# Patient Record
Sex: Female | Born: 1955 | Race: White | Hispanic: No | Marital: Married | Smoking: Former smoker
Health system: Southern US, Community
[De-identification: ages and names within clinical notes are randomized; demographics above are authoritative.]

## PROBLEM LIST (undated history)

## (undated) HISTORY — PX: DILATION AND CURETTAGE OF UTERUS: SHX78

---

## 2009-11-17 HISTORY — PX: COLONOSCOPY: SHX174

## 2014-07-23 ENCOUNTER — Emergency Department: Payer: Self-pay | Admitting: Internal Medicine

## 2014-07-23 LAB — CBC
HCT: 41.5 % (ref 35.0–47.0)
HGB: 14 g/dL (ref 12.0–16.0)
MCH: 31.7 pg (ref 26.0–34.0)
MCHC: 33.8 g/dL (ref 32.0–36.0)
MCV: 94 fL (ref 80–100)
Platelet: 332 10*3/uL (ref 150–440)
RBC: 4.43 10*6/uL (ref 3.80–5.20)
RDW: 12.7 % (ref 11.5–14.5)
WBC: 7.5 10*3/uL (ref 3.6–11.0)

## 2014-07-23 LAB — HEPATIC FUNCTION PANEL A (ARMC)
ALBUMIN: 4.1 g/dL (ref 3.4–5.0)
Alkaline Phosphatase: 111 U/L
Bilirubin, Direct: <0.1 mg/dL (ref 0.00–0.20)
Bilirubin,Total: 0.4 mg/dL (ref 0.2–1.0)
SGOT(AST): 33 U/L (ref 15–37)
SGPT (ALT): 30 U/L
Total Protein: 7.9 g/dL (ref 6.4–8.2)

## 2014-07-23 LAB — BASIC METABOLIC PANEL
Anion Gap: 10 (ref 7–16)
BUN: 10 mg/dL (ref 7–18)
CALCIUM: 8.8 mg/dL (ref 8.5–10.1)
CHLORIDE: 106 mmol/L (ref 98–107)
CO2: 22 mmol/L (ref 21–32)
Creatinine: 0.93 mg/dL (ref 0.60–1.30)
EGFR (African American): >60
EGFR (Non-African Amer.): >60
Glucose: 100 mg/dL — ABNORMAL HIGH (ref 65–99)
OSMOLALITY: 275 (ref 275–301)
POTASSIUM: 3.4 mmol/L — AB (ref 3.5–5.1)
SODIUM: 138 mmol/L (ref 136–145)

## 2014-07-23 LAB — D-DIMER(ARMC): D-Dimer: 454 ng/ml

## 2014-07-23 LAB — TROPONIN I: Troponin-I: <0.02 ng/mL

## 2015-04-26 ENCOUNTER — Ambulatory Visit
Admission: EM | Admit: 2015-04-26 | Discharge: 2015-04-26 | Disposition: A | Discharge status: Home / Self Care | Payer: Managed Care, Other (non HMO) | Attending: Family Medicine | Admitting: Family Medicine

## 2015-04-26 DIAGNOSIS — L03119 Cellulitis of unspecified part of limb: Secondary | ICD-10-CM | POA: Diagnosis not present

## 2015-04-26 MED ORDER — CEPHALEXIN 500 MG PO CAPS: 500.0000 mg | ORAL_CAPSULE | Freq: Three times a day (TID) | ORAL | Status: DC

## 2015-04-26 NOTE — ED Notes (Signed)
Pain and swelling in elbow x2 weeks. No known injury. Seen at [facility] for same a week ago.

## 2015-04-26 NOTE — ED Provider Notes (Signed)
CSN: [id number]     Arrival date & time 04/26/15  0946 History   First MD Initiated Contact with Patient 04/26/15 1026     Chief Complaint  Patient presents with  . Joint Swelling   (Consider location/radiation/quality/duration/timing/severity/associated sxs/prior Treatment) HPI Comments: 59 yo female with a 2 weeks h/o left elbow pain, redness and swelling. States was seen at [facility] a week ago and given keflex twice daily. Symptoms improved significantly but have not resolved. (patient showed pictures of condition one week ago and appearance a lot more extensive)  The history is provided by the patient.    History reviewed. No pertinent past medical history. Past Surgical History  Procedure Laterality Date  . Cesarean section     Family History  Problem Relation Age of Onset  . Diabetes Father    History  Substance Use Topics  . Smoking status: Former Games developer  . Smokeless tobacco: Not on file  . Alcohol Use: Yes     Comment: socially   OB History    No data available     Review of Systems  Allergies  Review of patient's allergies indicates no known allergies.  Home Medications   Prior to Admission medications   Medication Sig Start Date End Date Taking? Authorizing Provider  cephALEXin (KEFLEX) 500 MG capsule Take 1 capsule (500 mg total) by mouth 3 (three) times daily. 04/26/15   Payton Mccallum, MD   BP 116/85 mmHg  Pulse 73  Temp(Src) 97.6 F (36.4 C) (Oral)  Resp 16  Ht 5\' 9"  (1.753 m)  Wt 189 lb (85.73 kg)  BMI 27.90 kg/m2  SpO2 100% Physical Exam  Constitutional: She appears well-developed and well-nourished. No distress.  Musculoskeletal: She exhibits edema.       Left elbow: She exhibits swelling (mild). She exhibits normal range of motion and no laceration. Tenderness (mild) found.  Mild blanchable erythema approx 3cm over olecranon skin  Skin: She is not diaphoretic.  Nursing note and vitals reviewed.   ED Course  Procedures (including critical  care time) Labs Review Labs Reviewed - No data to display  Imaging Review No results found.   MDM   1. Cellulitis of elbow   (improving; resolving)    Discharge Medication List as of 04/26/2015 10:41 AM    START taking these medications   Details  cephALEXin (KEFLEX) 500 MG capsule Take 1 capsule (500 mg total) by mouth 3 (three) times daily., Starting 04/26/2015, Until Discontinued, Normal      Plan: 1. Diagnosis reviewed with patient; improving 2. rx as per orders; risks, benefits, potential side effects reviewed with patient; extend antibiotic for another week; NSAIDS prn 3. Recommend supportive treatment with warm compresses 4. F/u prn if symptoms worsen or don't improve    Payton Mccallum, MD 04/26/15 1320

## 2015-09-28 ENCOUNTER — Ambulatory Visit
Admission: EM | Admit: 2015-09-28 | Discharge: 2015-09-28 | Disposition: A | Discharge status: Home / Self Care | Payer: Managed Care, Other (non HMO) | Attending: Family Medicine | Admitting: Family Medicine

## 2015-09-28 ENCOUNTER — Ambulatory Visit (INDEPENDENT_AMBULATORY_CARE_PROVIDER_SITE_OTHER): Payer: Managed Care, Other (non HMO)

## 2015-09-28 DIAGNOSIS — M79671 Pain in right foot: Secondary | ICD-10-CM

## 2015-09-28 DIAGNOSIS — M79672 Pain in left foot: Secondary | ICD-10-CM | POA: Diagnosis not present

## 2015-09-28 MED ORDER — DICLOFENAC SODIUM 1 % TD GEL: 2.0000 g | Freq: Four times a day (QID) | TRANSDERMAL | Status: DC

## 2015-09-28 MED ORDER — NAPROXEN 500 MG PO TABS: 500.0000 mg | ORAL_TABLET | Freq: Two times a day (BID) | ORAL | Status: DC

## 2015-09-28 NOTE — Discharge Instructions (Signed)
Investigate shoes for better support and padding  Heat and ice alternating  Naproxen twice daily for 2 week trial   Tylenol as needed to headache  Arthritis Arthritis is a term that is commonly used to refer to joint pain or joint disease. There are more than 100 types of arthritis. CAUSES The most common cause of this condition is wear and tear of a joint. Other causes include:  Gout.  Inflammation of a joint.  An infection of a joint.  Sprains and other injuries near the joint.  A drug reaction or allergic reaction. In some cases, the cause may not be known. SYMPTOMS The main symptom of this condition is pain in the joint with movement. Other symptoms include:  Redness, swelling, or stiffness at a joint.  Warmth coming from the joint.  Fever.  Overall feeling of illness. DIAGNOSIS This condition may be diagnosed with a physical exam and tests, including:  Blood tests.  Urine tests.  Imaging tests, such as MRI, X-rays, or a CT scan. Sometimes, fluid is removed from a joint for testing. TREATMENT Treatment for this condition may involve:  Treatment of the cause, if it is known.  Rest.  Raising (elevating) the joint.  Applying cold or hot packs to the joint.  Medicines to improve symptoms and reduce inflammation.  Injections of a steroid such as cortisone into the joint to help reduce pain and inflammation. Depending on the cause of your arthritis, you may need to make lifestyle changes to reduce stress on your joint. These changes may include exercising more and losing weight. HOME CARE INSTRUCTIONS Medicines  Take over-the-counter and prescription medicines only as told by your health care provider.  Do not take aspirin to relieve pain if gout is suspected. Activities  Rest your joint if told by your health care provider. Rest is important when your disease is active and your joint feels painful, swollen, or stiff.  Avoid activities that make  the pain worse. It is important to balance activity with rest.  Exercise your joint regularly with range-of-motion exercises as told by your health care provider. Try doing low-impact exercise, such as:  Swimming.  Water aerobics.  Biking.  Walking. Joint Care  If your joint is swollen, keep it elevated if told by your health care provider.  If your joint feels stiff in the morning, try taking a warm shower.  If directed, apply heat to the joint. If you have diabetes, do not apply heat without permission from your health care provider.  Put a towel between the joint and the hot pack or heating pad.  Leave the heat on the area for 20-30 minutes.  If directed, apply ice to the joint:  Put ice in a plastic bag.  Place a towel between your skin and the bag.  Leave the ice on for 20 minutes, 2-3 times per day.  Keep all follow-up visits as told by your health care provider. This is important. SEEK MEDICAL CARE IF:  The pain gets worse.  You have a fever. SEEK IMMEDIATE MEDICAL CARE IF:  You develop severe joint pain, swelling, or redness.  Many joints become painful and swollen.  You develop severe back pain.  You develop severe weakness in your leg.  You cannot control your bladder or bowels.   This information is not intended to replace advice given to you by your health care provider. Make sure you discuss any questions you have with your health care provider.   Document Released:  12/11/2004 Document Revised: 07/25/2015 Document Reviewed: 01/29/2015 Elsevier Interactive Patient Education [employer].

## 2015-09-28 NOTE — ED Notes (Signed)
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 Wednesday night when got home state right ankle/foot had "pain that would not go away". Painful to weight bear. No real swelling noted

## 2015-09-30 ENCOUNTER — Encounter: Payer: Self-pay | Admitting: Physician Assistant

## 2015-09-30 NOTE — ED Provider Notes (Signed)
CSN: [id number]     Arrival date & time 09/28/15  1103 History   First MD Initiated Contact with Patient 09/28/15 1257     Chief Complaint  Patient presents with  . Ankle Pain   (Consider location/radiation/quality/duration/timing/severity/associated sxs/prior Treatment) HPI 59 yo F presents with bilateral foot pain- mid-foot chronic with occasional increased flare. 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 Wears Skechers sneakers " because I always have" an continues to have pain. 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 Denies and specific traumatic incident Does not have PCP connection  History reviewed. No pertinent past medical history. Past Surgical History  Procedure Laterality Date  . Cesarean section     Family History  Problem Relation Age of Onset  . Diabetes Father    Social History  Substance Use Topics  . Smoking status: Former Games developer  . Smokeless tobacco: None  . Alcohol Use: Yes     Comment: socially   OB History    No data available     Review of Systems Constitutional: No fever.  Eyes: No visual changes. ENT:No sore throat. Cardiovascular:Negative for chest pain/palpitations Respiratory: Negative for shortness of breath Gastrointestinal: No abdominal pain. No nausea,vomiting, diarrhea Genitourinary: Negative for dysuria. Normal urination. Musculoskeletal: Negative for back pain. FROM extremities, has bilateral mid-foot pain as described HPI Skin: Negative for rash Neurological: Negative for headache, focal weakness or numbness  Allergies  Review of patient's allergies indicates no known allergies.  Home Medications   Prior to Admission medications   Medication Sig Start Date End Date Taking? Authorizing Provider  cephALEXin (KEFLEX)  500 MG capsule Take 1 capsule (500 mg total) by mouth 3 (three) times daily. 04/26/15   Payton Mccallum, MD  diclofenac sodium (VOLTAREN) 1 % GEL Apply 2 g topically 4 (four) times daily. As needed 09/28/15   Rae Halsted, PA-C  naproxen (NAPROSYN) 500 MG tablet Take 1 tablet (500 mg total) by mouth 2 (two) times daily. 09/28/15   Rae Halsted, PA-C   Meds Ordered and Administered this Visit  Medications - No data to display  Pulse 79  Temp(Src) 96.9 F (36.1 C) (Tympanic)  Resp 16  Ht 5' 9.5" (1.765 m)  Wt 195 lb (88.451 kg)  BMI 28.39 kg/m2  SpO2 100%  LMP  No data found.   Physical Exam  General: NAD, does not appear toxic HEENT:conjugate gaze, conjunctiva clear,no pharyngeal erythema, no exudate, canals clear,no erythema of TMs, no dental  Issues Neck: supple,no lymphadenopathy Resp : CT A, bilat Card : RRR Abd:  Not distended Extremities- Bilateral feet and ankles are uncomfortable to ROM and manipulation particularly of the mid foot. No heat, no edema , no evidence of trauma  Has some early arthritic changes in her bilateral hands Neuro :face symmetric, :PERRLA, EOMI, tongue midline, shoulder adduction, good attention,recall-good memory,ambulatory without assistance   ED Course  Procedures (including critical care time)  Labs Review Labs Reviewed - No data to display  Imaging Review Dg Ankle Complete Right  09/28/2015  CLINICAL DATA:  Right ankle pain for 3 days, no known injury, initial encounter EXAM:: EXAM: RIGHT ANKLE - COMPLETE 3+ VIEW COMPARISON:  None. FINDINGS: No acute fracture is noted. Degenerative changes are noted in the tarsal bones as well as  calcaneal spurring. No gross soft tissue abnormality is seen. IMPRESSION: No acute abnormality noted. Electronically Signed   By: Alcide CleverMark  Lukens M.D.   On: 09/28/2015 13:08   Dg Foot Complete Right  09/28/2015  CLINICAL DATA:  Three day history of ankle and foot pain. No known injury. EXAM: RIGHT FOOT COMPLETE - 3+ VIEW  COMPARISON:  None. FINDINGS: The joint spaces are maintained. No acute fracture or bone lesion. Calcaneal spurring changes are noted. Multiple tug lesions/ enthesophytes. IMPRESSION: No acute bony findings. Electronically Signed   By: Rudie MeyerP.  Gallerani M.D.   On: 09/28/2015 13:07    Ofered Toradol which was deferred by patient- Prefers ibuprofen and tylenol at home  Discussed films negative for fracture but indications of arthiritis Will try Naproxen BID consistently  ( to use no other NSAIDS during med trial ) may add Tylenol prn Printed Rx to check on availability of Voltaren as she really doesn't like to take pills Careful ptietn education that Voltaren , if she gets it is dose specific and NOT to be used for PRN rub. May use OTC sports cream for comfort. Ice paks and elevation after work  Seek more supportive footwear- consider tennis specific for increased sole cushioning- Delegation of ladder work to her stff Strongly encouraged connection with PCP for long term interventions and possible additional referral if indicated   MDM   1. Foot pain, bilateral    Plan: Test/x-ray results and diagnosis reviewed with patient Rx as per orders;  benefits, risks, potential side effects reviewed   Recommend supportive treatment as noted above Seek additional medical care if symptoms worsen or are not improving F/U PRN     Rae HalstedLaurie W Dilara Navarrete, PA-C 09/30/15 1056

## 2015-10-23 ENCOUNTER — Encounter: Payer: Self-pay | Admitting: Family Medicine

## 2015-10-23 ENCOUNTER — Ambulatory Visit (INDEPENDENT_AMBULATORY_CARE_PROVIDER_SITE_OTHER): Payer: Managed Care, Other (non HMO) | Admitting: Family Medicine

## 2015-10-23 VITALS — BP 130/100 | HR 78 | Ht 69.0 in | Wt 198.0 lb

## 2015-10-23 DIAGNOSIS — M722 Plantar fascial fibromatosis: Secondary | ICD-10-CM | POA: Insufficient documentation

## 2015-10-23 NOTE — Progress Notes (Signed)
Date:  10/23/2015   Name:  Lindsay LittenRenee E Levy   DOB:  12-06-1955   MRN:  [id number]  PCP:  Ralph DowdyKLEIN,KOMBIZ, DO    Chief Complaint: Establish Care and Follow-up   History of Present Illness:  This is a 59 y.o. female who injured R foot stepping on pipe one year ago, told bruised heel at time but later told fractured, saw ortho 12/2014 who said surgery would not help, has not seen podiatry. Has had persistent pain since injury, worse in AM and with prolonged standing, localizes to medial arch, radiates into ankle. Naprosyn not helping much, using ACE wrap which helps some.  Review of Systems:  Review of Systems  There are no active problems to display for this patient.   Prior to Admission medications   Medication Sig Start Date End Date Taking? Authorizing Provider  naproxen sodium (ANAPROX) 220 MG tablet Take 220 mg by mouth 3 (three) times daily with meals.   Yes Historical Provider, MD    No Known Allergies  Past Surgical History  Procedure Laterality Date  . Cesarean section    . Dilation and curettage of uterus      miscarriages  . Colonoscopy  2011    cleared for 10 yrs    Social History  Substance Use Topics  . Smoking status: Former Games developermoker  . Smokeless tobacco: None  . Alcohol Use: 0.0 oz/week    0 Standard drinks or equivalent per week     Comment: socially    Family History  Problem Relation Age of Onset  . Diabetes Father     Medication list has been reviewed and updated.  Physical Examination: BP 130/100 mmHg  Pulse 78  Ht 5\' 9"  (1.753 m)  Wt 198 lb (89.812 kg)  BMI 29.23 kg/m2  Physical Exam  Constitutional: She appears well-developed and well-nourished.  Musculoskeletal: She exhibits no edema.  Full ROM but increased pain with plantar flexion and inversion, tender over medial arch  Neurological: She is alert.  Skin: Skin is warm and dry.  Psychiatric: She has a normal mood and affect. Her behavior is normal.  Nursing note and vitals  reviewed.   Assessment and Plan:  1. Plantar fasciitis of right foot Posttraumatic, persistent, cont ACE wrap/Naprosyn, refer podiatry - Ambulatory referral to Podiatry   Return if symptoms worsen or fail to improve.  Dionne AnoWilliam M. Kingsley SpittlePlonk, Jr. MD [facility]  10/23/2015

## 2017-06-26 IMAGING — CR DG FOOT COMPLETE 3+V*R*
3 series · 3 of 3 positions shown · non-contrast
Comparison: None.

CLINICAL DATA: Three day history of ankle and foot pain. No known
injury.

EXAM:
RIGHT FOOT COMPLETE - 3+ VIEW

[foot ap]
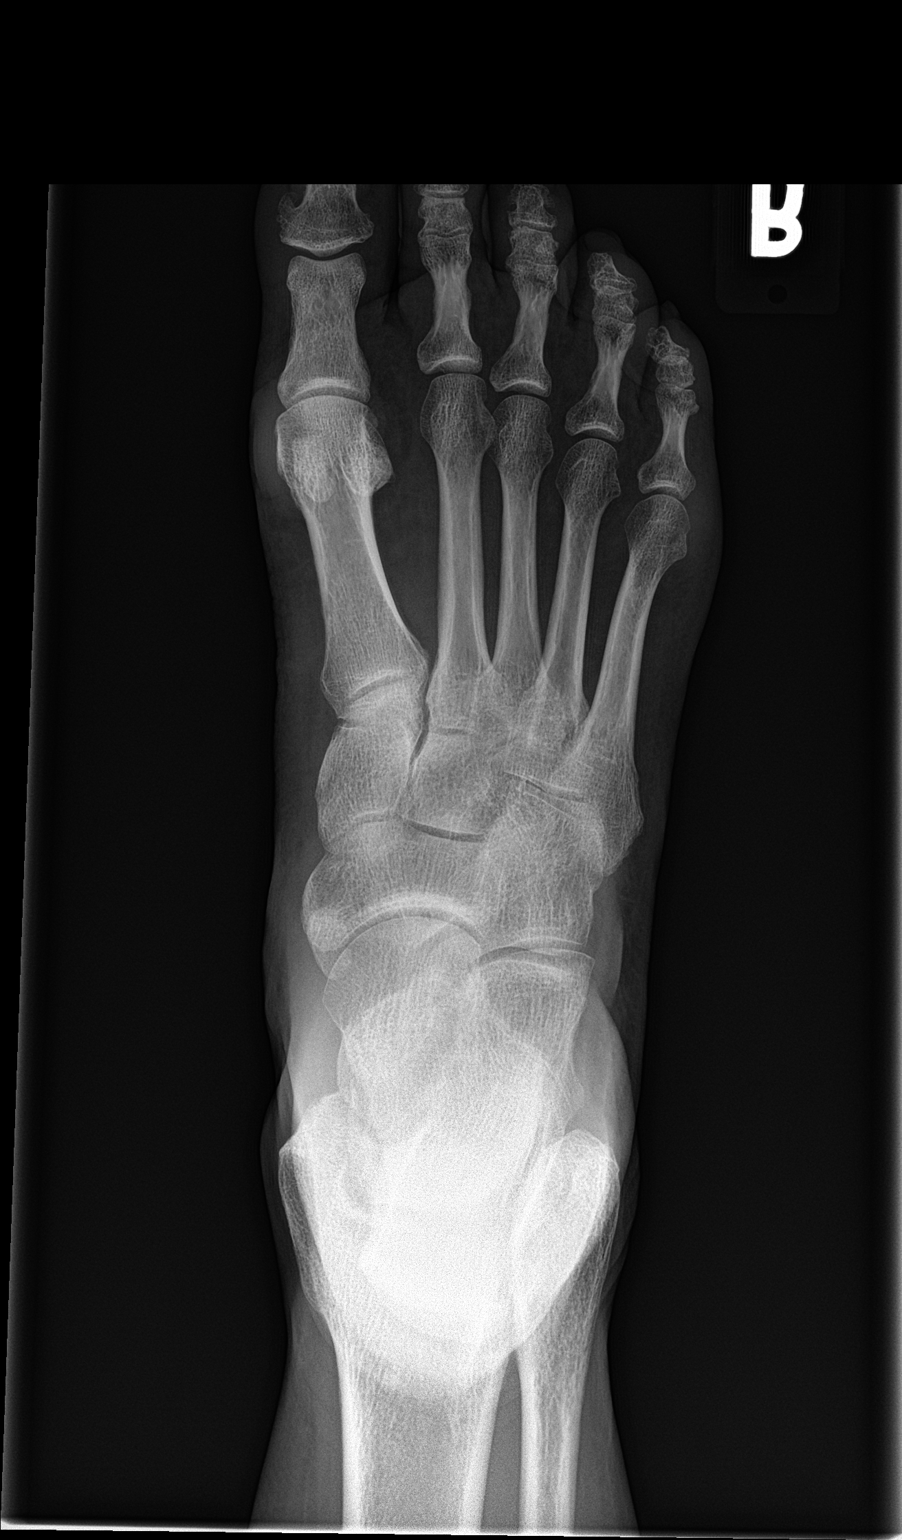

[foot obl]
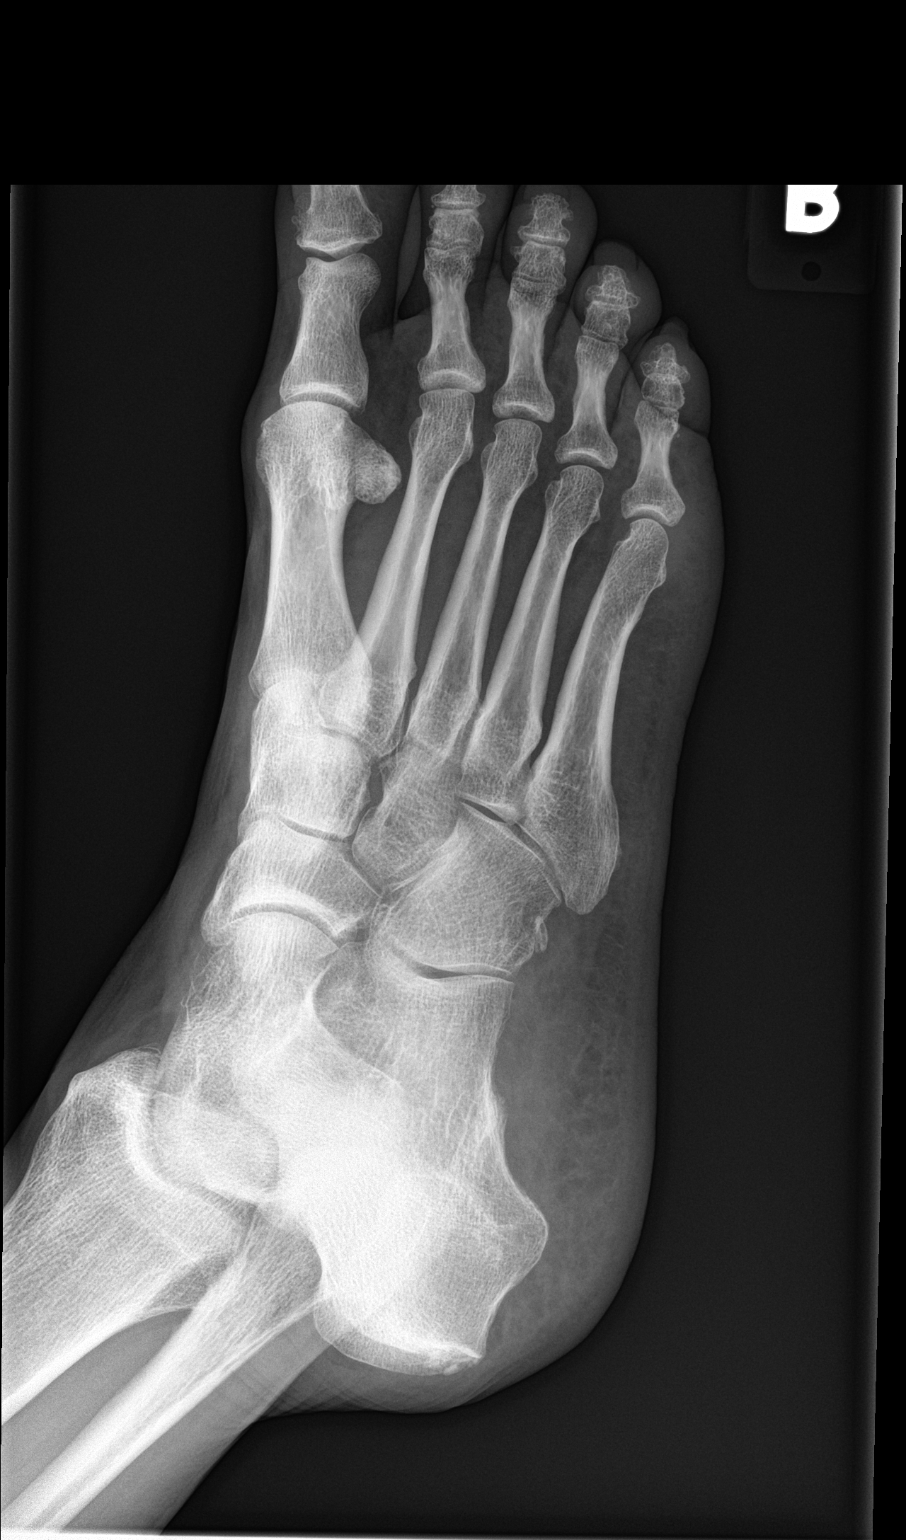

[foot lat]
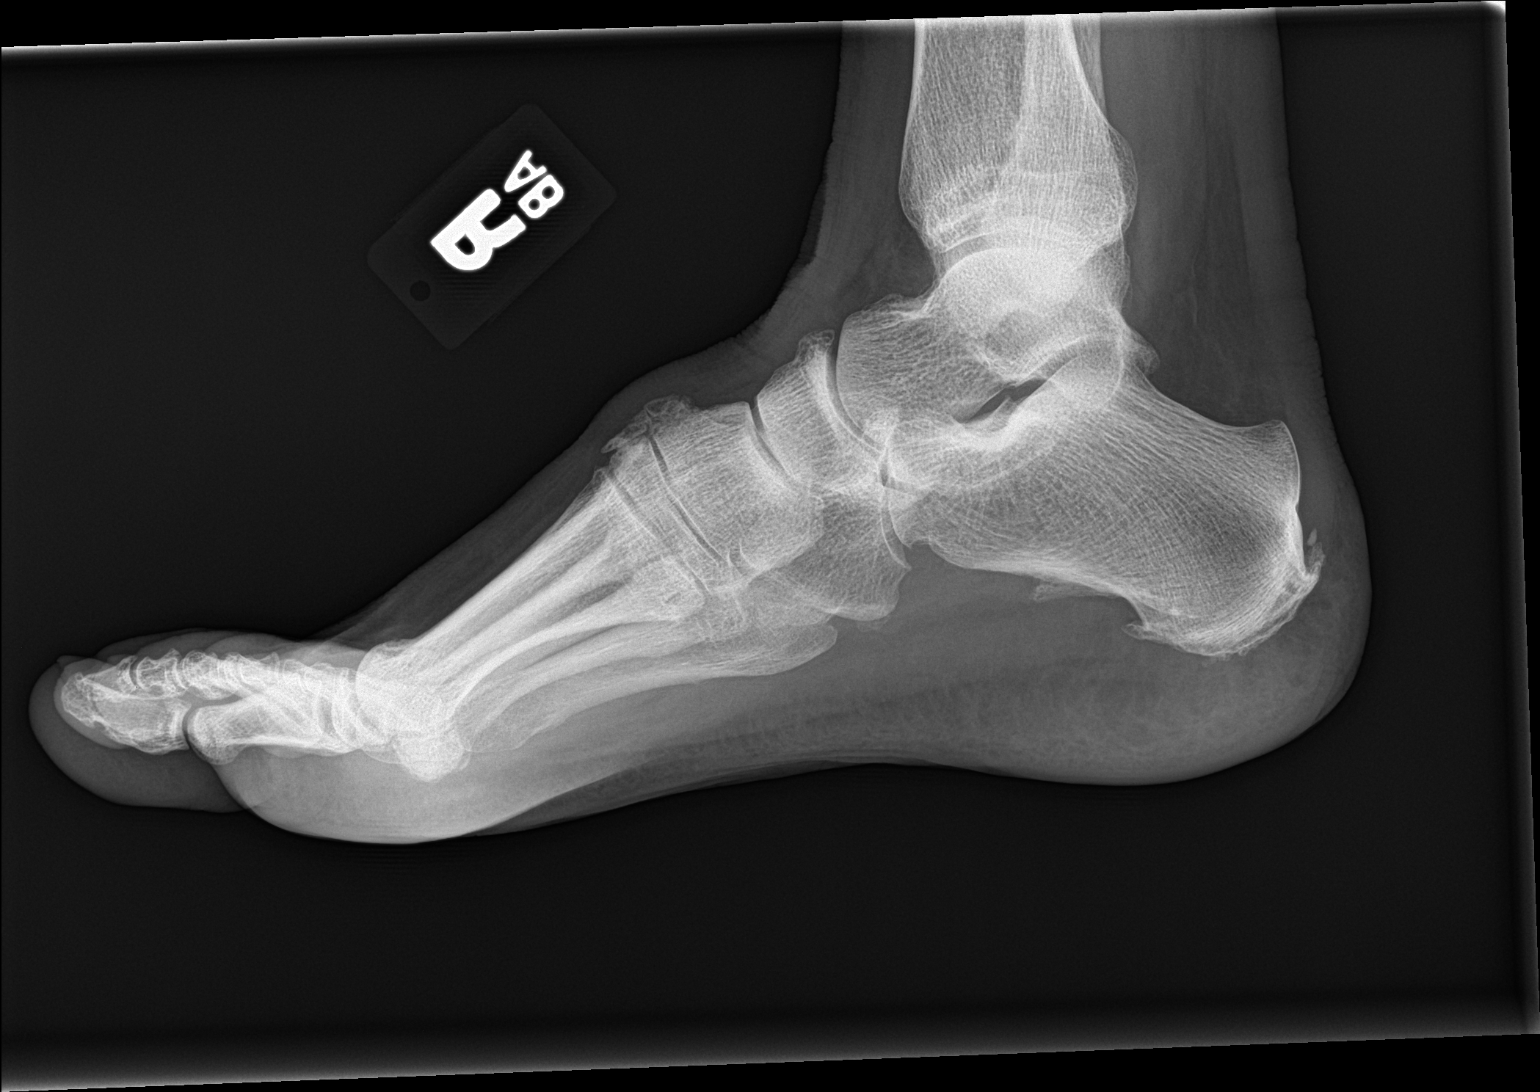

[3 of 3 positions shown; findings below may reference images not displayed]

FINDINGS: The joint spaces are maintained. No acute fracture or bone lesion.
Calcaneal spurring changes are noted. Multiple tug lesions/
enthesophytes.
IMPRESSION: No acute bony findings.
